# Patient Record
Sex: Male | Born: 1965 | Race: White | Hispanic: No | Smoking: Current every day smoker
Health system: Southern US, Community
[De-identification: ages and names within clinical notes are randomized; demographics above are authoritative.]

## PROBLEM LIST (undated history)

## (undated) DIAGNOSIS — E559 Vitamin D deficiency, unspecified: Secondary | ICD-10-CM

## (undated) DIAGNOSIS — F419 Anxiety disorder, unspecified: Secondary | ICD-10-CM

## (undated) DIAGNOSIS — R51 Headache: Secondary | ICD-10-CM

## (undated) DIAGNOSIS — M1990 Unspecified osteoarthritis, unspecified site: Secondary | ICD-10-CM

## (undated) DIAGNOSIS — K219 Gastro-esophageal reflux disease without esophagitis: Secondary | ICD-10-CM

## (undated) DIAGNOSIS — R519 Headache, unspecified: Secondary | ICD-10-CM

## (undated) DIAGNOSIS — F4310 Post-traumatic stress disorder, unspecified: Secondary | ICD-10-CM

## (undated) HISTORY — PX: FINGER SURGERY: SHX640

## (undated) HISTORY — PX: KNEE SURGERY: SHX244

## (undated) HISTORY — PX: TONSILLECTOMY: SUR1361

## (undated) HISTORY — PX: SHOULDER SURGERY: SHX246

---

## 2015-01-03 ENCOUNTER — Other Ambulatory Visit (HOSPITAL_BASED_OUTPATIENT_CLINIC_OR_DEPARTMENT_OTHER): Payer: Self-pay | Admitting: General Practice

## 2015-01-03 ENCOUNTER — Ambulatory Visit (HOSPITAL_BASED_OUTPATIENT_CLINIC_OR_DEPARTMENT_OTHER)
Admission: RE | Admit: 2015-01-03 | Discharge: 2015-01-03 | Disposition: A | Discharge status: Home / Self Care | Payer: Commercial Managed Care - PPO | Source: Ambulatory Visit | Attending: General Practice | Admitting: General Practice

## 2015-01-03 DIAGNOSIS — N50812 Left testicular pain: Secondary | ICD-10-CM

## 2015-01-03 DIAGNOSIS — N508 Other specified disorders of male genital organs: Secondary | ICD-10-CM | POA: Diagnosis present

## 2015-07-01 ENCOUNTER — Other Ambulatory Visit (HOSPITAL_COMMUNITY): Payer: Self-pay | Admitting: Neurological Surgery

## 2015-07-22 ENCOUNTER — Encounter (HOSPITAL_COMMUNITY): Payer: Self-pay

## 2015-07-22 ENCOUNTER — Encounter (HOSPITAL_COMMUNITY)
Admission: RE | Admit: 2015-07-22 | Discharge: 2015-07-22 | Disposition: A | Discharge status: Home / Self Care | Payer: Commercial Managed Care - PPO | Source: Ambulatory Visit | Attending: Neurological Surgery | Admitting: Neurological Surgery

## 2015-07-22 ENCOUNTER — Other Ambulatory Visit (HOSPITAL_COMMUNITY): Payer: Self-pay | Admitting: *Deleted

## 2015-07-22 ENCOUNTER — Ambulatory Visit (HOSPITAL_COMMUNITY)
Admission: RE | Admit: 2015-07-22 | Discharge: 2015-07-22 | Disposition: A | Discharge status: Home / Self Care | Payer: Commercial Managed Care - PPO | Source: Ambulatory Visit | Attending: Neurological Surgery | Admitting: Neurological Surgery

## 2015-07-22 DIAGNOSIS — Z01811 Encounter for preprocedural respiratory examination: Secondary | ICD-10-CM | POA: Insufficient documentation

## 2015-07-22 DIAGNOSIS — F17200 Nicotine dependence, unspecified, uncomplicated: Secondary | ICD-10-CM

## 2015-07-22 DIAGNOSIS — M4802 Spinal stenosis, cervical region: Secondary | ICD-10-CM

## 2015-07-22 HISTORY — DX: Gastro-esophageal reflux disease without esophagitis: K21.9

## 2015-07-22 HISTORY — DX: Anxiety disorder, unspecified: F41.9

## 2015-07-22 HISTORY — DX: Headache, unspecified: R51.9

## 2015-07-22 HISTORY — DX: Unspecified osteoarthritis, unspecified site: M19.90

## 2015-07-22 HISTORY — DX: Vitamin D deficiency, unspecified: E55.9

## 2015-07-22 HISTORY — DX: Headache: R51

## 2015-07-22 HISTORY — DX: Post-traumatic stress disorder, unspecified: F43.10

## 2015-07-22 LAB — BASIC METABOLIC PANEL
ANION GAP: 8 (ref 5–15)
BUN: 7 mg/dL (ref 6–20)
CALCIUM: 10 mg/dL (ref 8.9–10.3)
CO2: 28 mmol/L (ref 22–32)
Chloride: 102 mmol/L (ref 101–111)
Creatinine, Ser: 0.93 mg/dL (ref 0.61–1.24)
GFR calc Af Amer: >60 mL/min (ref >60)
GFR calc non Af Amer: >60 mL/min (ref >60)
GLUCOSE: 91 mg/dL (ref 65–99)
Potassium: 4.6 mmol/L (ref 3.5–5.1)
Sodium: 138 mmol/L (ref 135–145)

## 2015-07-22 LAB — NO BLOOD PRODUCTS

## 2015-07-22 LAB — CBC WITH DIFFERENTIAL/PLATELET
BASOS PCT: 1 %
Basophils Absolute: 0.1 10*3/uL (ref 0.0–0.1)
Eosinophils Absolute: 0.3 10*3/uL (ref 0.0–0.7)
Eosinophils Relative: 3 %
HEMATOCRIT: 43.5 % (ref 39.0–52.0)
Hemoglobin: 14.9 g/dL (ref 13.0–17.0)
LYMPHS ABS: 3.8 10*3/uL (ref 0.7–4.0)
LYMPHS PCT: 45 %
MCH: 32.7 pg (ref 26.0–34.0)
MCHC: 34.3 g/dL (ref 30.0–36.0)
MCV: 95.6 fL (ref 78.0–100.0)
MONO ABS: 0.6 10*3/uL (ref 0.1–1.0)
MONOS PCT: 8 %
NEUTROS ABS: 3.6 10*3/uL (ref 1.7–7.7)
Neutrophils Relative %: 43 %
Platelets: 320 10*3/uL (ref 150–400)
RBC: 4.55 MIL/uL (ref 4.22–5.81)
RDW: 12 % (ref 11.5–15.5)
WBC: 8.4 10*3/uL (ref 4.0–10.5)

## 2015-07-22 LAB — SURGICAL PCR SCREEN
MRSA, PCR: NEGATIVE
Staphylococcus aureus: POSITIVE — AB

## 2015-07-22 LAB — PROTIME-INR
INR: 0.94 (ref 0.00–1.49)
Prothrombin Time: 12.8 seconds (ref 11.6–15.2)

## 2015-07-22 NOTE — Pre-Procedure Instructions (Signed)
Anthony Russo  07/22/2015     Your procedure is scheduled on Wednesday, July 24, 2015 at 2:00 PM.   Report to [facility] Entrance "A" Admitting Office at 12:00 Noon.   Call this number if you have problems the morning of surgery: [phone]   Any questions prior to day of surgery, please call [phone] between 8 & 4 PM.    Remember:  Do not eat food or drink liquids after midnight Tuesday, 07/23/15.  Take these medicines the morning of surgery with A SIP OF WATER: Bupropion (Wellbutrin), Cetirizine (Zyrtec), Fluoxetine (Prozac), Gabapentin (Neurontin), Loratadine (Claritin), Omeprazole (Prilosec), Cyclobenzaprine (Flexeril), Hydrocodone - if needed  Stop Multivitamins as of today   Do not wear jewelry.  Do not wear lotions, powders, or cologne.  You may wear deodorant.  Men may shave face and neck.  Do not bring valuables to the hospital.  [facility] is not responsible for any belongings or valuables.  Contacts, dentures or bridgework may not be worn into surgery.  Leave your suitcase in the car.  After surgery it may be brought to your room.  For patients admitted to the hospital, discharge time will be determined by your treatment team.  Special instructions:  [place] - Preparing for Surgery  Before surgery, you can play an important role.  Because skin is not sterile, your skin needs to be as free of germs as possible.  You can reduce the number of germs on you skin by washing with CHG (chlorahexidine gluconate) soap before surgery.  CHG is an antiseptic cleaner which kills germs and bonds with the skin to continue killing germs even after washing.  Please DO NOT use if you have an allergy to CHG or antibacterial soaps.  If your skin becomes reddened/irritated stop using the CHG and inform your nurse when you arrive at Short Stay.  Do not shave (including legs and underarms) for at least 48 hours prior to the first CHG shower.  You may shave your  face.  Please follow these instructions carefully:   1.  Shower with CHG Soap the night before surgery and the                                morning of Surgery.  2.  If you choose to wash your hair, wash your hair first as usual with your       normal shampoo.  3.  After you shampoo, rinse your hair and body thoroughly to remove the                      Shampoo.  4.  Use CHG as you would any other liquid soap.  You can apply chg directly       to the skin and wash gently with scrungie or a clean washcloth.  5.  Apply the CHG Soap to your body ONLY FROM THE NECK DOWN.        Do not use on open wounds or open sores.  Avoid contact with your eyes, ears, mouth and genitals (private parts).  Wash genitals (private parts) with your normal soap.  6.  Wash thoroughly, paying special attention to the area where your surgery        will be performed.  7.  Thoroughly rinse your body with warm water from the neck down.  8.  DO NOT shower/wash with your normal  soap after using and rinsing off       the CHG Soap.  9.  Pat yourself dry with a clean towel.            10.  Wear clean pajamas.            11.  Place clean sheets on your bed the night of your first shower and do not        sleep with pets.  Day of Surgery  Do not apply any lotions the morning of surgery.  Please wear clean clothes to the hospital.   Please read over the following fact sheets that you were given. Pain Booklet, Coughing and Deep Breathing, MRSA Information and Surgical Site Infection Prevention

## 2015-07-22 NOTE — Progress Notes (Signed)
Mupirocin Ointment Rx called into [facility] in [place] for positive PCR of Staph. Pt notified and voiced understanding.

## 2015-07-22 NOTE — Progress Notes (Signed)
Pt denies cardiac history, chest pain or sob. He states he did have an EKG done in October at [facility]. 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 Written report in EPIC, have requested EKG tracing from [facility] and also the ED notes.  Pt is a Jehovah's Witness, Blood refusal consent form signed and faxed to the Blood Bank. Pt did not have his Blood/Healthcare Power of [place] with him today, requested that pt bring that with him DOS.

## 2015-07-23 MED ORDER — DEXTROSE 5 % IV SOLN
3.0000 g | INTRAVENOUS | Status: AC
  Administered 2015-07-24: 3 g via INTRAVENOUS
  Filled 2015-07-23: qty 3000

## 2015-07-23 MED ORDER — DEXAMETHASONE SODIUM PHOSPHATE 10 MG/ML IJ SOLN
10.0000 mg | INTRAMUSCULAR | Status: AC
  Administered 2015-07-24: 10 mg via INTRAVENOUS

## 2015-07-24 ENCOUNTER — Encounter (HOSPITAL_COMMUNITY)
Admission: AD | Disposition: A | Discharge status: Home / Self Care | Payer: Self-pay | Source: Ambulatory Visit | Attending: Neurological Surgery

## 2015-07-24 ENCOUNTER — Encounter (HOSPITAL_COMMUNITY): Payer: Self-pay | Admitting: Certified Registered"

## 2015-07-24 ENCOUNTER — Inpatient Hospital Stay (HOSPITAL_COMMUNITY): Payer: Commercial Managed Care - PPO | Admitting: Certified Registered"

## 2015-07-24 ENCOUNTER — Inpatient Hospital Stay (HOSPITAL_COMMUNITY)
Admission: AD | Admit: 2015-07-24 | Discharge: 2015-07-25 | DRG: 473 | Disposition: A | Discharge status: Home / Self Care | Payer: Commercial Managed Care - PPO | Source: Ambulatory Visit | Attending: Neurological Surgery | Admitting: Neurological Surgery

## 2015-07-24 ENCOUNTER — Inpatient Hospital Stay (HOSPITAL_COMMUNITY): Payer: Commercial Managed Care - PPO

## 2015-07-24 DIAGNOSIS — M4802 Spinal stenosis, cervical region: Principal | ICD-10-CM | POA: Diagnosis present

## 2015-07-24 DIAGNOSIS — M50221 Other cervical disc displacement at C4-C5 level: Secondary | ICD-10-CM | POA: Diagnosis present

## 2015-07-24 DIAGNOSIS — Z419 Encounter for procedure for purposes other than remedying health state, unspecified: Secondary | ICD-10-CM

## 2015-07-24 DIAGNOSIS — F419 Anxiety disorder, unspecified: Secondary | ICD-10-CM | POA: Diagnosis present

## 2015-07-24 DIAGNOSIS — Z981 Arthrodesis status: Secondary | ICD-10-CM

## 2015-07-24 DIAGNOSIS — Z791 Long term (current) use of non-steroidal anti-inflammatories (NSAID): Secondary | ICD-10-CM

## 2015-07-24 DIAGNOSIS — K219 Gastro-esophageal reflux disease without esophagitis: Secondary | ICD-10-CM | POA: Diagnosis present

## 2015-07-24 DIAGNOSIS — M479 Spondylosis, unspecified: Secondary | ICD-10-CM | POA: Diagnosis present

## 2015-07-24 DIAGNOSIS — Z01812 Encounter for preprocedural laboratory examination: Secondary | ICD-10-CM

## 2015-07-24 DIAGNOSIS — Z7952 Long term (current) use of systemic steroids: Secondary | ICD-10-CM

## 2015-07-24 DIAGNOSIS — E559 Vitamin D deficiency, unspecified: Secondary | ICD-10-CM | POA: Diagnosis present

## 2015-07-24 DIAGNOSIS — F17210 Nicotine dependence, cigarettes, uncomplicated: Secondary | ICD-10-CM | POA: Diagnosis present

## 2015-07-24 DIAGNOSIS — F4310 Post-traumatic stress disorder, unspecified: Secondary | ICD-10-CM | POA: Diagnosis present

## 2015-07-24 HISTORY — PX: ANTERIOR CERVICAL DECOMP/DISCECTOMY FUSION: SHX1161

## 2015-07-24 SURGERY — ANTERIOR CERVICAL DECOMPRESSION/DISCECTOMY FUSION 2 LEVELS
Anesthesia: General | Site: Neck

## 2015-07-24 MED ORDER — MIDAZOLAM HCL 2 MG/2ML IJ SOLN
INTRAMUSCULAR | Status: AC
  Filled 2015-07-24: qty 2

## 2015-07-24 MED ORDER — SUGAMMADEX SODIUM 500 MG/5ML IV SOLN
INTRAVENOUS | Status: AC
  Filled 2015-07-24: qty 5

## 2015-07-24 MED ORDER — CEFAZOLIN SODIUM 1-5 GM-% IV SOLN
1.0000 g | Freq: Three times a day (TID) | INTRAVENOUS | Status: AC
  Administered 2015-07-24 – 2015-07-25 (×2): 1 g via INTRAVENOUS
  Filled 2015-07-24 (×2): qty 50

## 2015-07-24 MED ORDER — ROCURONIUM BROMIDE 50 MG/5ML IV SOLN
INTRAVENOUS | Status: AC
  Filled 2015-07-24: qty 1

## 2015-07-24 MED ORDER — ROCURONIUM BROMIDE 100 MG/10ML IV SOLN
INTRAVENOUS | Status: DC | PRN
  Administered 2015-07-24: 50 mg via INTRAVENOUS
  Administered 2015-07-24 (×3): 10 mg via INTRAVENOUS

## 2015-07-24 MED ORDER — LIDOCAINE HCL (CARDIAC) 20 MG/ML IV SOLN
INTRAVENOUS | Status: AC
  Filled 2015-07-24: qty 5

## 2015-07-24 MED ORDER — PREDNISONE 50 MG PO TABS: 50.0000 mg | ORAL_TABLET | Freq: Every day | ORAL | Status: DC

## 2015-07-24 MED ORDER — MEPERIDINE HCL 25 MG/ML IJ SOLN: 6.2500 mg | INTRAMUSCULAR | Status: DC | PRN

## 2015-07-24 MED ORDER — FENTANYL CITRATE (PF) 250 MCG/5ML IJ SOLN
INTRAMUSCULAR | Status: DC | PRN
  Administered 2015-07-24: 150 ug via INTRAVENOUS
  Administered 2015-07-24 (×7): 50 ug via INTRAVENOUS
  Administered 2015-07-24: 100 ug via INTRAVENOUS

## 2015-07-24 MED ORDER — PROPOFOL 10 MG/ML IV BOLUS
INTRAVENOUS | Status: AC
  Filled 2015-07-24: qty 20

## 2015-07-24 MED ORDER — SODIUM CHLORIDE 0.9 % IR SOLN
Status: DC | PRN
  Administered 2015-07-24: 14:00:00

## 2015-07-24 MED ORDER — FENTANYL CITRATE (PF) 250 MCG/5ML IJ SOLN
INTRAMUSCULAR | Status: AC
  Filled 2015-07-24: qty 5

## 2015-07-24 MED ORDER — GABAPENTIN 100 MG PO CAPS
100.0000 mg | ORAL_CAPSULE | Freq: Three times a day (TID) | ORAL | Status: DC
  Administered 2015-07-24: 100 mg via ORAL
  Filled 2015-07-24: qty 1

## 2015-07-24 MED ORDER — MORPHINE SULFATE (PF) 2 MG/ML IV SOLN: 1.0000 mg | INTRAVENOUS | Status: DC | PRN

## 2015-07-24 MED ORDER — FLUOXETINE HCL 20 MG PO TABS
40.0000 mg | ORAL_TABLET | Freq: Every day | ORAL | Status: DC
  Filled 2015-07-24: qty 2

## 2015-07-24 MED ORDER — THROMBIN 20000 UNITS EX SOLR
CUTANEOUS | Status: DC | PRN
  Administered 2015-07-24: 14:00:00 via TOPICAL

## 2015-07-24 MED ORDER — OXYCODONE-ACETAMINOPHEN 5-325 MG PO TABS
1.0000 | ORAL_TABLET | ORAL | Status: DC | PRN
  Administered 2015-07-24 – 2015-07-25 (×2): 2 via ORAL
  Filled 2015-07-24: qty 2

## 2015-07-24 MED ORDER — ONDANSETRON HCL 4 MG/2ML IJ SOLN: 4.0000 mg | INTRAMUSCULAR | Status: DC | PRN

## 2015-07-24 MED ORDER — 0.9 % SODIUM CHLORIDE (POUR BTL) OPTIME
TOPICAL | Status: DC | PRN
  Administered 2015-07-24: 1000 mL

## 2015-07-24 MED ORDER — LIDOCAINE HCL (CARDIAC) 20 MG/ML IV SOLN
INTRAVENOUS | Status: DC | PRN
  Administered 2015-07-24: 100 mg via INTRAVENOUS

## 2015-07-24 MED ORDER — HYDROMORPHONE HCL 1 MG/ML IJ SOLN
INTRAMUSCULAR | Status: AC
  Administered 2015-07-24: 0.5 mg via INTRAVENOUS
  Filled 2015-07-24: qty 1

## 2015-07-24 MED ORDER — ONDANSETRON HCL 4 MG/2ML IJ SOLN
INTRAMUSCULAR | Status: DC | PRN
  Administered 2015-07-24: 4 mg via INTRAVENOUS

## 2015-07-24 MED ORDER — ONDANSETRON HCL 4 MG/2ML IJ SOLN
INTRAMUSCULAR | Status: AC
  Filled 2015-07-24: qty 2

## 2015-07-24 MED ORDER — MENTHOL 3 MG MT LOZG: 1.0000 | LOZENGE | OROMUCOSAL | Status: DC | PRN

## 2015-07-24 MED ORDER — PROPOFOL 10 MG/ML IV BOLUS
INTRAVENOUS | Status: DC | PRN
  Administered 2015-07-24: 170 mg via INTRAVENOUS

## 2015-07-24 MED ORDER — POTASSIUM CHLORIDE IN NACL 20-0.9 MEQ/L-% IV SOLN
INTRAVENOUS | Status: DC
  Filled 2015-07-24 (×2): qty 1000

## 2015-07-24 MED ORDER — BUPROPION HCL ER (SR) 150 MG PO TB12
150.0000 mg | ORAL_TABLET | Freq: Two times a day (BID) | ORAL | Status: DC
  Administered 2015-07-24: 150 mg via ORAL
  Filled 2015-07-24 (×2): qty 1

## 2015-07-24 MED ORDER — HYDROMORPHONE HCL 1 MG/ML IJ SOLN
0.2500 mg | INTRAMUSCULAR | Status: DC | PRN
  Administered 2015-07-24 (×2): 0.5 mg via INTRAVENOUS

## 2015-07-24 MED ORDER — THROMBIN 5000 UNITS EX SOLR
OROMUCOSAL | Status: DC | PRN
  Administered 2015-07-24: 14:00:00 via TOPICAL

## 2015-07-24 MED ORDER — SODIUM CHLORIDE 0.9 % IJ SOLN: 3.0000 mL | INTRAMUSCULAR | Status: DC | PRN

## 2015-07-24 MED ORDER — MIDAZOLAM HCL 5 MG/5ML IJ SOLN
INTRAMUSCULAR | Status: DC | PRN
  Administered 2015-07-24: 2 mg via INTRAVENOUS

## 2015-07-24 MED ORDER — ACETAMINOPHEN 650 MG RE SUPP
650.0000 mg | RECTAL | Status: DC | PRN
Start: 2015-07-24 — End: 2015-07-25

## 2015-07-24 MED ORDER — SODIUM CHLORIDE 0.9 % IV SOLN: 250.0000 mL | INTRAVENOUS | Status: DC

## 2015-07-24 MED ORDER — FLUOXETINE HCL 20 MG PO CAPS
40.0000 mg | ORAL_CAPSULE | Freq: Every day | ORAL | Status: DC
  Administered 2015-07-24: 40 mg via ORAL
  Filled 2015-07-24: qty 2

## 2015-07-24 MED ORDER — LACTATED RINGERS IV SOLN
INTRAVENOUS | Status: DC
  Administered 2015-07-24 (×2): via INTRAVENOUS

## 2015-07-24 MED ORDER — SENNA 8.6 MG PO TABS
1.0000 | ORAL_TABLET | Freq: Two times a day (BID) | ORAL | Status: DC
  Administered 2015-07-24: 8.6 mg via ORAL
  Filled 2015-07-24: qty 1

## 2015-07-24 MED ORDER — CYCLOBENZAPRINE HCL 10 MG PO TABS
10.0000 mg | ORAL_TABLET | Freq: Three times a day (TID) | ORAL | Status: DC | PRN
  Administered 2015-07-24: 10 mg via ORAL
  Filled 2015-07-24: qty 1

## 2015-07-24 MED ORDER — SUGAMMADEX SODIUM 500 MG/5ML IV SOLN
INTRAVENOUS | Status: DC | PRN
  Administered 2015-07-24: 30 mg via INTRAVENOUS

## 2015-07-24 MED ORDER — PHENOL 1.4 % MT LIQD: 1.0000 | OROMUCOSAL | Status: DC | PRN

## 2015-07-24 MED ORDER — MUPIROCIN 2 % EX OINT
1.0000 "application " | TOPICAL_OINTMENT | Freq: Two times a day (BID) | CUTANEOUS | Status: DC
  Administered 2015-07-24: 1 via NASAL
  Filled 2015-07-24: qty 22

## 2015-07-24 MED ORDER — SODIUM CHLORIDE 0.9 % IJ SOLN
3.0000 mL | Freq: Two times a day (BID) | INTRAMUSCULAR | Status: DC
  Administered 2015-07-24: 3 mL via INTRAVENOUS

## 2015-07-24 MED ORDER — OXYCODONE-ACETAMINOPHEN 5-325 MG PO TABS
ORAL_TABLET | ORAL | Status: AC
  Administered 2015-07-24: 2 via ORAL
  Filled 2015-07-24: qty 2

## 2015-07-24 MED ORDER — ONDANSETRON HCL 4 MG/2ML IJ SOLN: 4.0000 mg | Freq: Once | INTRAMUSCULAR | Status: DC | PRN

## 2015-07-24 MED ORDER — ACETAMINOPHEN 325 MG PO TABS: 650.0000 mg | ORAL_TABLET | ORAL | Status: DC | PRN

## 2015-07-24 SURGICAL SUPPLY — 60 items
APL SKNCLS STERI-STRIP NONHPOA (GAUZE/BANDAGES/DRESSINGS) ×2
BAG DECANTER FOR FLEXI CONT (MISCELLANEOUS) ×4 IMPLANT
BENZOIN TINCTURE PRP APPL 2/3 (GAUZE/BANDAGES/DRESSINGS) ×4 IMPLANT
BIT DRILL 13 (BIT) ×2 IMPLANT
BIT DRILL 13MM (BIT) ×1
BUR MATCHSTICK NEURO 3.0 LAGG (BURR) ×4 IMPLANT
CAGE LORDOTIC 8 SM PLUS (Cage) ×4 IMPLANT
CAGE SMALL 7X13X15 (Cage) ×4 IMPLANT
CANISTER SUCT 3000ML PPV (MISCELLANEOUS) ×4 IMPLANT
CLOSURE WOUND 1/2 X4 (GAUZE/BANDAGES/DRESSINGS) ×1
DRAPE C-ARM 42X72 X-RAY (DRAPES) ×8 IMPLANT
DRAPE LAPAROTOMY 100X72 PEDS (DRAPES) ×4 IMPLANT
DRAPE MICROSCOPE LEICA (MISCELLANEOUS) ×4 IMPLANT
DRAPE POUCH INSTRU U-SHP 10X18 (DRAPES) ×4 IMPLANT
DRSG OPSITE POSTOP 4X6 (GAUZE/BANDAGES/DRESSINGS) ×4 IMPLANT
DURAPREP 6ML APPLICATOR 50/CS (WOUND CARE) ×4 IMPLANT
ELECT COATED BLADE 2.86 ST (ELECTRODE) ×4 IMPLANT
ELECT REM PT RETURN 9FT ADLT (ELECTROSURGICAL) ×4
ELECTRODE REM PT RTRN 9FT ADLT (ELECTROSURGICAL) ×2 IMPLANT
GAUZE SPONGE 4X4 16PLY XRAY LF (GAUZE/BANDAGES/DRESSINGS) IMPLANT
GLOVE BIO SURGEON STRL SZ8 (GLOVE) ×4 IMPLANT
GLOVE BIOGEL PI IND STRL 7.0 (GLOVE) ×1 IMPLANT
GLOVE BIOGEL PI IND STRL 7.5 (GLOVE) ×2 IMPLANT
GLOVE BIOGEL PI INDICATOR 7.0 (GLOVE) ×2
GLOVE BIOGEL PI INDICATOR 7.5 (GLOVE) ×2
GLOVE ECLIPSE 6.5 STRL STRAW (GLOVE) ×4 IMPLANT
GLOVE SS N UNI LF 6.5 STRL (GLOVE) ×9 IMPLANT
GLOVE SS N UNI LF 7.0 STRL (GLOVE) ×12 IMPLANT
GOWN STRL REUS W/ TWL LRG LVL3 (GOWN DISPOSABLE) ×4 IMPLANT
GOWN STRL REUS W/ TWL XL LVL3 (GOWN DISPOSABLE) ×3 IMPLANT
GOWN STRL REUS W/TWL 2XL LVL3 (GOWN DISPOSABLE) IMPLANT
GOWN STRL REUS W/TWL LRG LVL3 (GOWN DISPOSABLE) ×8
GOWN STRL REUS W/TWL XL LVL3 (GOWN DISPOSABLE) ×8
HALTER HD/CHIN CERV TRACTION D (MISCELLANEOUS) IMPLANT
HEMOSTAT POWDER KIT SURGIFOAM (HEMOSTASIS) ×4 IMPLANT
KIT BASIN OR (CUSTOM PROCEDURE TRAY) ×4 IMPLANT
KIT ROOM TURNOVER OR (KITS) ×4 IMPLANT
NDL HYPO 25X1 1.5 SAFETY (NEEDLE) ×1 IMPLANT
NDL SPNL 20GX3.5 QUINCKE YW (NEEDLE) ×1 IMPLANT
NEEDLE HYPO 25X1 1.5 SAFETY (NEEDLE) ×4 IMPLANT
NEEDLE SPNL 20GX3.5 QUINCKE YW (NEEDLE) ×4 IMPLANT
NS IRRIG 1000ML POUR BTL (IV SOLUTION) ×4 IMPLANT
PACK LAMINECTOMY NEURO (CUSTOM PROCEDURE TRAY) ×4 IMPLANT
PAD ARMBOARD 7.5X6 YLW CONV (MISCELLANEOUS) ×7 IMPLANT
PIN DISTRACTION 14MM (PIN) ×8 IMPLANT
PLATE 2 42.5XLCK NS SPNE CVD (Plate) ×1 IMPLANT
PLATE 2 ATLANTIS TRANS (Plate) ×4 IMPLANT
RUBBERBAND STERILE (MISCELLANEOUS) ×8 IMPLANT
SCREW SD 15MM FA (Screw) ×9 IMPLANT
SCREW ST 15X4XST FXANG NS (Screw) ×8 IMPLANT
SCREW ST FIX 4 ATL (Screw) ×16 IMPLANT
SPONGE INTESTINAL PEANUT (DISPOSABLE) ×4 IMPLANT
SPONGE SURGIFOAM ABS GEL 100 (HEMOSTASIS) ×4 IMPLANT
STRIP CLOSURE SKIN 1/2X4 (GAUZE/BANDAGES/DRESSINGS) ×3 IMPLANT
SUT VIC AB 3-0 SH 8-18 (SUTURE) ×7 IMPLANT
TOWEL OR 17X24 6PK STRL BLUE (TOWEL DISPOSABLE) ×4 IMPLANT
TOWEL OR 17X26 10 PK STRL BLUE (TOWEL DISPOSABLE) ×4 IMPLANT
TRAP SPECIMEN MUCOUS 40CC (MISCELLANEOUS) ×4 IMPLANT
TRAY FOLEY CATH 16FRSI W/METER (SET/KITS/TRAYS/PACK) ×4 IMPLANT
WATER STERILE IRR 1000ML POUR (IV SOLUTION) ×4 IMPLANT

## 2015-07-24 NOTE — Anesthesia Procedure Notes (Signed)
Procedure Name: Intubation Date/Time: 07/24/2015 2:25 PM Performed by: Jerilee HohMUMM, Lynne Righi N Pre-anesthesia Checklist: Patient identified, Emergency Drugs available, Suction available and Patient being monitored Patient Re-evaluated:Patient Re-evaluated prior to inductionOxygen Delivery Method: Circle system utilized Preoxygenation: Pre-oxygenation with 100% oxygen Intubation Type: IV induction Ventilation: Mask ventilation without difficulty and Oral airway inserted - appropriate to patient size Laryngoscope Size: Glidescope and 3 Grade View: Grade III Tube type: Oral Tube size: 7.5 mm Number of attempts: 2 Airway Equipment and Method: Stylet and Video-laryngoscopy Placement Confirmation: ETT inserted through vocal cords under direct vision,  positive ETCO2 and breath sounds checked- equal and bilateral Secured at: 22 cm Tube secured with: Tape Dental Injury: Teeth and Oropharynx as per pre-operative assessment  Comments: Easy mask with oral airway in place. DL with MAC 4, view of epiglottis only. DL with Glidescope 3, partial view of cords with Glidescope, atraumatic oral intubation.

## 2015-07-24 NOTE — Transfer of Care (Signed)
Immediate Anesthesia Transfer of Care Note  Patient: Anthony Russo  Procedure(s) Performed: Procedure(s): Anterior Cervical Discectomy Fusion Cervical four-five, Cervical five-six  Patient Location: PACU  Anesthesia Type:General  Level of Consciousness: awake, alert , oriented and patient cooperative  Airway & Oxygen Therapy: Patient Spontanous Breathing and Patient connected to nasal cannula oxygen  Post-op Assessment: Report given to RN, Post -op Vital signs reviewed and stable and Patient moving all extremities  Post vital signs: Reviewed and stable  Last Vitals:  Filed Vitals:   07/24/15 1154 07/24/15 1706  BP: 141/84   Pulse: 102   Temp: 37.1 C 36.6 C  Resp: 18     Complications: No apparent anesthesia complications

## 2015-07-24 NOTE — Anesthesia Preprocedure Evaluation (Signed)
Anesthesia Evaluation  Patient identified by MRN, date of birth, ID band Patient awake    Reviewed: Allergy & Precautions, NPO status , Patient's Chart, lab work & pertinent test results  Airway Mallampati: I  TM Distance: >3 FB Neck ROM: Full    Dental   Pulmonary Current Smoker,    Pulmonary exam normal        Cardiovascular Normal cardiovascular exam     Neuro/Psych    GI/Hepatic GERD  Medicated and Controlled,  Endo/Other    Renal/GU      Musculoskeletal   Abdominal   Peds  Hematology   Anesthesia Other Findings   Reproductive/Obstetrics                             Anesthesia Physical Anesthesia Plan  ASA: II  Anesthesia Plan: General   Post-op Pain Management:    Induction: Intravenous  Airway Management Planned: Oral ETT  Additional Equipment:   Intra-op Plan:   Post-operative Plan: Extubation in OR  Informed Consent:   Plan Discussed with: CRNA and Surgeon  Anesthesia Plan Comments:         Anesthesia Quick Evaluation

## 2015-07-24 NOTE — H&P (Signed)
Subjective:   Patient is a 49 y.o. male admitted for left arm pain. The patient first presented to me with complaints of neck pain, shooting pains in the arm(s) and numbness of the arm(s). Onset of symptoms was several months ago. The pain is described as aching and stabbing and occurs all day. The pain is rated severe, and is located  at the base of the neck and radiates to the left arm. The symptoms have been progressive. Symptoms are exacerbated by extending head backwards, and are relieved by none.  Previous work up includes MRI of cervical spine, results: spinal stenosis.  Past Medical History  Diagnosis Date  . Anxiety   . PTSD (post-traumatic stress disorder)   . GERD (gastroesophageal reflux disease)   . Headache     migraines  . Arthritis     neck and right wrist  . Vitamin D deficiency     Past Surgical History  Procedure Laterality Date  . Shoulder surgery Right   . Knee surgery Right     removed schrapnel from knee  . Tonsillectomy    . Finger surgery Right     ring finger reattached    No Known Allergies  Social History  Substance Use Topics  . Smoking status: Current Every Day Smoker -- 0.50 packs/day    Types: Cigarettes  . Smokeless tobacco: Former NeurosurgeonUser  . Alcohol Use: Yes     Comment: occasional    Family History  Problem Relation Age of Onset  . Hypertension Mother   . COPD Mother   . Lupus Mother   . Lung cancer Father    Prior to Admission medications   Medication Sig Start Date End Date Taking? Authorizing Provider  buPROPion (WELLBUTRIN SR) 150 MG 12 hr tablet Take 150 mg by mouth 2 (two) times daily.   Yes Historical Provider, MD  cetirizine (ZYRTEC) 10 MG tablet Take 10 mg by mouth daily.   Yes Historical Provider, MD  cholecalciferol (VITAMIN D) 1000 UNITS tablet Take 1,000 Units by mouth daily.   Yes Historical Provider, MD  cyclobenzaprine (FLEXERIL) 10 MG tablet Take 10 mg by mouth daily.   Yes Historical Provider, MD  FLUoxetine (PROZAC) 40  MG capsule Take 40 mg by mouth daily.   Yes Historical Provider, MD  gabapentin (NEURONTIN) 100 MG capsule Take 100 mg by mouth 3 (three) times daily.   Yes Historical Provider, MD  HYDROcodone-acetaminophen (NORCO/VICODIN) 5-325 MG tablet Take 1 tablet by mouth every 6 (six) hours as needed for moderate pain.   Yes Historical Provider, MD  ibuprofen (ADVIL,MOTRIN) 800 MG tablet Take 800 mg by mouth every 8 (eight) hours as needed for mild pain.   Yes Historical Provider, MD  loratadine (CLARITIN) 10 MG tablet Take 10 mg by mouth daily.   Yes Historical Provider, MD  meloxicam (MOBIC) 15 MG tablet Take 15 mg by mouth daily.   Yes Historical Provider, MD  Multiple Vitamin (MULTIVITAMIN WITH MINERALS) TABS tablet Take 1 tablet by mouth daily.   Yes Historical Provider, MD  omeprazole (PRILOSEC) 40 MG capsule Take 40 mg by mouth daily.   Yes Historical Provider, MD  predniSONE (DELTASONE) 50 MG tablet Take 50 mg by mouth daily with breakfast.   Yes Historical Provider, MD     Review of Systems  Positive ROS: Negative  All other systems have been reviewed and were otherwise negative with the exception of those mentioned in the HPI and as above.  Objective: Vital signs in last  24 hours: Temp:  [98.7 F (37.1 C)] 98.7 F (37.1 C) (11/30 1154) Pulse Rate:  [102] 102 (11/30 1154) Resp:  [18] 18 (11/30 1154) BP: (141)/(84) 141/84 mmHg (11/30 1154) SpO2:  [97 %] 97 % (11/30 1154) Weight:  [131.77 kg (290 lb 8 oz)] 131.77 kg (290 lb 8 oz) (11/30 1154)  General Appearance: Alert, cooperative, no distress, appears stated age Head: Normocephalic, without obvious abnormality, atraumatic Eyes: PERRL, conjunctiva/corneas clear, EOM's intact      Neck: Supple, symmetrical, trachea midline, Back: Symmetric, no curvature, ROM normal, no CVA tenderness Lungs:  respirations unlabored Heart: Regular rate and rhythm Abdomen: Soft, non-tender Extremities: Extremities normal, atraumatic, no cyanosis or  edema Pulses: 2+ and symmetric all extremities Skin: Skin color, texture, turgor normal, no rashes or lesions  NEUROLOGIC:  Mental status: Alert and oriented x4, no aphasia, good attention span, fund of knowledge and memory  Motor Exam - grossly normal Sensory Exam - grossly normal Reflexes: 1+ Coordination - grossly normal Gait - grossly normal Balance - grossly normal Cranial Nerves: I: smell Not tested  II: visual acuity  OS: nl    OD: nl  II: visual fields Full to confrontation  II: pupils Equal, round, reactive to light  III,VII: ptosis None  III,IV,VI: extraocular muscles  Full ROM  V: mastication Normal  V: facial light touch sensation  Normal  V,VII: corneal reflex  Present  VII: facial muscle function - upper  Normal  VII: facial muscle function - lower Normal  VIII: hearing Not tested  IX: soft palate elevation  Normal  IX,X: gag reflex Present  XI: trapezius strength  5/5  XI: sternocleidomastoid strength 5/5  XI: neck flexion strength  5/5  XII: tongue strength  Normal    Data Review Lab Results  Component Value Date   WBC 8.4 07/22/2015   HGB 14.9 07/22/2015   HCT 43.5 07/22/2015   MCV 95.6 07/22/2015   PLT 320 07/22/2015   Lab Results  Component Value Date   NA 138 07/22/2015   K 4.6 07/22/2015   CL 102 07/22/2015   CO2 28 07/22/2015   BUN 7 07/22/2015   CREATININE 0.93 07/22/2015   GLUCOSE 91 07/22/2015   Lab Results  Component Value Date   INR 0.94 07/22/2015    Assessment:   Cervical neck pain with herniated nucleus pulposus/ spondylosis/ stenosis at C4-5 C5-6 C6-7. Patient has failed conservative therapy. Planned surgery : ACDF with plating C4-5 C5-C6 C6-7  Plan:   I explained the condition and procedure to the patient and answered any questions.  Patient wishes to proceed with procedure as planned. Understands risks/ benefits/ and expected or typical outcomes.  Remi Rester S 07/24/2015 2:06 PM

## 2015-07-24 NOTE — Op Note (Signed)
07/24/2015  5:01 PM  PATIENT:  Anthony Russo  49 y.o. male  PRE-OPERATIVE DIAGNOSIS:  Cervical spinal stenosis with neck and left arm pain  POST-OPERATIVE DIAGNOSIS:  Same   PROCEDURE:  1. Decompressive anterior cervical discectomy C4-5 C5-6, 2. Anterior cervical arthrodesis C4-5 C5-6 utilizing peek interbody cages packed with morselized autograft, 3. Anterior cervical plating C4-C6 utilizing a Atlantis translational plate  SURGEON:  Marikay Alaravid Sanuel Ladnier, MD  ASSISTANTS: Dr. Alease FrameNund Kumar  ANESTHESIA:   General  EBL: 100 ml  Total I/O In: 1600 [I.V.:1600] Out: 430 [Urine:330; Blood:100]  BLOOD ADMINISTERED:none  DRAINS: None   SPECIMEN:  No Specimen  INDICATION FOR PROCEDURE: This patient presented with a long history of neck and left arm pain. MRI showed cervical spondylosis at C4-5 C5-6 and C6-7 with moderate canal stenosis and bilateral foraminal stenosis C4-5, severe canal stenosis at C5-6, and a left-sided osteophyte at C6-7. My original plan was to do an ACDF with plating at C4-5 C5-6 and C6-7. Upon exposure of the anterior cervical spine and there was a large flowing osteophyte from the top of C5 to the bottom of C7. His C6-7 interspace had a significant lordotic angle pointing down into the chest and I did not feel that I could move the osteophyte and then perform an adequate discectomy without doing a C6 corpectomy. I did not believe this was necessary and therefore simply chose to complete my surgery at C4-5 and C5-6. I explained to him prior to the surgery that I may not be able to complete the C6-7 ACDF. Patient understood the risks, benefits, and alternatives and potential outcomes and wished to proceed.  PROCEDURE DETAILS: Patient was brought to the operating room placed under general endotracheal anesthesia. Patient was placed in the supine position on the operating room table. The neck was prepped with Duraprep and draped in a sterile fashion.   Three cc of local anesthesia was  injected and a transverse incision was made on the right side of the neck.  Dissection was carried down thru the subcutaneous tissue and the platysma was  elevated, opened, and undermined with Metzenbaum scissors.  Dissection was then carried out thru an avascular plane leaving the sternocleidomastoid carotid artery and jugular vein laterally and the trachea and esophagus medially. The ventral aspect of the vertebral column was identified and a localizing x-ray was taken. The C4-5 level was identified. It was a large flowing osteophyte from the top of C5 to the bottom of C7. There is a large osteophyte over the C4-5 disc space also. This was removed with a Leksell rongeur. The longus colli muscles were then elevated and the retractor was placed. The annulus was incised at C4-5 and the disc space entered. I then had to drill the anterior cervical spine at what I thought was the C5-6 level and to identify the C5-6 disc. This was because of the large anterior flowing osteophyte. Because of the angle of the spine and the large osteophyte over the C6-7 disc space and the lack of cord compression at C6-7 I decided to abort the C6-7 level. I did not feel I'll be able to adequately get the angle to perform a discectomy at that level without doing a corpectomy. Discectomy at C4-5 and C5-6 was performed with micro-curettes and pituitary rongeurs. I then used the high-speed drill to drill the endplates down to the level of the posterior longitudinal ligament. The drill shavings were saved in a mucous trap for later arthrodesis. The operating microscope was draped  and brought into the field provided additional magnification, illumination and visualization. Discectomy was continued posteriorly thru the disc space. Posterior longitudinal ligament was opened with a nerve hook, and then removed along with disc herniation and osteophytes, decompressing the spinal canal and thecal sac. We then continued to remove osteophytic  overgrowth and disc material decompressing the neural foramina and exiting nerve roots bilaterally. There were large osteophytes at both levels, on the left at C4-5 and in the midline and paracentral to the left at C5-6. The scope was angled up and down to help decompress and undercut the vertebral bodies. Once the decompression was completed we could pass a nerve hook circumferentially to assure adequate decompression in the midline and in the neural foramina. So by both visualization and palpation we felt we had an adequate decompression of the neural elements. We then measured the height of the intravertebral disc space and selected a 7 mm at C4-5 and 8 millimeter Peek interbody cage at C5-6 , packed with autograft.  It was then gently positioned in the intravertebral disc space and countersunk. I then used a Atlantis translational plate and placed fixed angle screws into the vertebral bodies and locked them into position. The wound was irrigated with bacitracin solution, checked for hemostasis which was established and confirmed. Once meticulous hemostasis was achieved, we then proceeded with closure. The platysma was closed with interrupted 3-0 undyed Vicryl suture, the subcuticular layer was closed with interrupted 3-0 undyed Vicryl suture. The skin edges were approximated with steristrips. The drapes were removed. A sterile dressing was applied. The patient was then awakened from general anesthesia and transferred to the recovery room in stable condition. At the end of the procedure all sponge, needle and instrument counts were correct.   PLAN OF CARE: Admit for overnight observation  PATIENT DISPOSITION:  PACU - hemodynamically stable.   Delay start of Pharmacological VTE agent (>24hrs) due to surgical blood loss or risk of bleeding:  yes

## 2015-07-25 ENCOUNTER — Encounter (HOSPITAL_COMMUNITY): Payer: Self-pay | Admitting: Neurological Surgery

## 2015-07-25 DIAGNOSIS — F17210 Nicotine dependence, cigarettes, uncomplicated: Secondary | ICD-10-CM | POA: Diagnosis present

## 2015-07-25 DIAGNOSIS — K219 Gastro-esophageal reflux disease without esophagitis: Secondary | ICD-10-CM | POA: Diagnosis present

## 2015-07-25 DIAGNOSIS — M79602 Pain in left arm: Secondary | ICD-10-CM | POA: Diagnosis present

## 2015-07-25 DIAGNOSIS — Z791 Long term (current) use of non-steroidal anti-inflammatories (NSAID): Secondary | ICD-10-CM | POA: Diagnosis not present

## 2015-07-25 DIAGNOSIS — E559 Vitamin D deficiency, unspecified: Secondary | ICD-10-CM | POA: Diagnosis present

## 2015-07-25 DIAGNOSIS — Z01812 Encounter for preprocedural laboratory examination: Secondary | ICD-10-CM | POA: Diagnosis not present

## 2015-07-25 DIAGNOSIS — F4310 Post-traumatic stress disorder, unspecified: Secondary | ICD-10-CM | POA: Diagnosis present

## 2015-07-25 DIAGNOSIS — M4802 Spinal stenosis, cervical region: Secondary | ICD-10-CM | POA: Diagnosis present

## 2015-07-25 DIAGNOSIS — M479 Spondylosis, unspecified: Secondary | ICD-10-CM | POA: Diagnosis present

## 2015-07-25 DIAGNOSIS — Z7952 Long term (current) use of systemic steroids: Secondary | ICD-10-CM | POA: Diagnosis not present

## 2015-07-25 DIAGNOSIS — M50221 Other cervical disc displacement at C4-C5 level: Secondary | ICD-10-CM | POA: Diagnosis present

## 2015-07-25 DIAGNOSIS — F419 Anxiety disorder, unspecified: Secondary | ICD-10-CM | POA: Diagnosis present

## 2015-07-25 MED ORDER — CYCLOBENZAPRINE HCL 10 MG PO TABS: 10.0000 mg | ORAL_TABLET | Freq: Three times a day (TID) | ORAL | Status: AC | PRN

## 2015-07-25 MED ORDER — HYDROCODONE-ACETAMINOPHEN 5-325 MG PO TABS: 1.0000 | ORAL_TABLET | Freq: Four times a day (QID) | ORAL | Status: AC | PRN

## 2015-07-25 NOTE — Progress Notes (Signed)
Patient alert and oriented, mae's well, voiding adequate amount of urine, swallowing without difficulty, no c/o pain. Patient discharged home with family. Script and discharged instructions given to patient. Patient and family stated understanding of d/c instructions given and has an appointment with MD. 

## 2015-07-25 NOTE — Discharge Summary (Signed)
Physician Discharge Summary  Patient ID: Anthony Russo MRN: [id number] DOB/AGE: 50-08-1965 50 y.o.  Admit date: 07/24/2015 Discharge date: 07/25/2015  Admission Diagnoses: cervical stenosis    Discharge Diagnoses: same   Discharged Condition: good  Hospital Course: The patient was admitted on 07/24/2015 and taken to the operating room where the patient underwent ACDF. The patient tolerated the procedure well and was taken to the recovery room and then to the floor in stable condition. The hospital course was routine. There were no complications. The wound remained clean dry and intact. Pt had appropriate neck soreness. No complaints of arm pain or new N/T/W. The patient remained afebrile with stable vital signs, and tolerated a regular diet. The patient continued to increase activities, and pain was well controlled with oral pain medications.   Consults: None  Significant Diagnostic Studies:  Results for orders placed or performed during the hospital encounter of 07/22/15  Surgical pcr screen  Result Value Ref Range   MRSA, PCR NEGATIVE NEGATIVE   Staphylococcus aureus POSITIVE (A) NEGATIVE  Basic metabolic panel  Result Value Ref Range   Sodium 138 135 - 145 mmol/L   Potassium 4.6 3.5 - 5.1 mmol/L   Chloride 102 101 - 111 mmol/L   CO2 28 22 - 32 mmol/L   Glucose, Bld 91 65 - 99 mg/dL   BUN 7 6 - 20 mg/dL   Creatinine, Ser 7.82 0.61 - 1.24 mg/dL   Calcium 95.6 8.9 - 21.3 mg/dL   GFR calc non Af Amer >60 >60 mL/min   GFR calc Af Amer >60 >60 mL/min   Anion gap 8 5 - 15  CBC WITH DIFFERENTIAL  Result Value Ref Range   WBC 8.4 4.0 - 10.5 K/uL   RBC 4.55 4.22 - 5.81 MIL/uL   Hemoglobin 14.9 13.0 - 17.0 g/dL   HCT 08.6 57.8 - 46.9 %   MCV 95.6 78.0 - 100.0 fL   MCH 32.7 26.0 - 34.0 pg   MCHC 34.3 30.0 - 36.0 g/dL   RDW 62.9 52.8 - 41.3 %   Platelets 320 150 - 400 K/uL   Neutrophils Relative % 43 %   Neutro Abs 3.6 1.7 - 7.7 K/uL   Lymphocytes Relative 45 %   Lymphs  Abs 3.8 0.7 - 4.0 K/uL   Monocytes Relative 8 %   Monocytes Absolute 0.6 0.1 - 1.0 K/uL   Eosinophils Relative 3 %   Eosinophils Absolute 0.3 0.0 - 0.7 K/uL   Basophils Relative 1 %   Basophils Absolute 0.1 0.0 - 0.1 K/uL  Protime-INR  Result Value Ref Range   Prothrombin Time 12.8 11.6 - 15.2 seconds   INR 0.94 0.00 - 1.49  No blood products  Result Value Ref Range   Transfuse no blood products      TRANSFUSE NO BLOOD PRODUCTS, VERIFIED BY TERESA CRABTREE,RN    Chest 2 View  07/22/2015  CLINICAL DATA:  49 year old male -preoperative respiratory evaluation prior to cervical spine surgery. Smoker. EXAM: CHEST  2 VIEW COMPARISON:  None. FINDINGS: The cardiomediastinal silhouette is unremarkable. There is no evidence of focal airspace disease, pulmonary edema, suspicious pulmonary nodule/mass, pleural effusion, or pneumothorax. No acute bony abnormalities are identified. A dense structure along the right first costochondral junction appears bony. IMPRESSION: No active cardiopulmonary disease. Electronically Signed   By: Harmon Pier M.D.   On: 07/22/2015 14:29   Dg Cervical Spine 2-3 Views  07/24/2015  CLINICAL DATA:  C4-6 ACDF EXAM: DG C-ARM 61-120 MIN;  CERVICAL SPINE - 2-3 VIEW COMPARISON:  None FLUOROSCOPY TIME:  24 seconds FINDINGS: Intraoperative fluoroscopic spot image. Anterior cervical fusion at C4-6 with plate and screw in place. IMPRESSION: ACDF C4-6. Electronically Signed   By: Elige KoHetal  Patel   On: 07/24/2015 17:00   Dg C-arm 1-60 Min  07/24/2015  CLINICAL DATA:  C4-6 ACDF EXAM: DG C-ARM 61-120 MIN; CERVICAL SPINE - 2-3 VIEW COMPARISON:  None FLUOROSCOPY TIME:  24 seconds FINDINGS: Intraoperative fluoroscopic spot image. Anterior cervical fusion at C4-6 with plate and screw in place. IMPRESSION: ACDF C4-6. Electronically Signed   By: Elige KoHetal  Patel   On: 07/24/2015 17:00    Antibiotics:  Anti-infectives    Start     Dose/Rate Route Frequency Ordered Stop   07/24/15 2200  ceFAZolin  (ANCEF) IVPB 1 g/50 mL premix     1 g 100 mL/hr over 30 Minutes Intravenous Every 8 hours 07/24/15 2038 07/25/15 0625   07/24/15 1400  bacitracin 50,000 Units in sodium chloride irrigation 0.9 % 500 mL irrigation  Status:  Discontinued       As needed 07/24/15 1458 07/24/15 1704   07/24/15 1330  ceFAZolin (ANCEF) 3 g in dextrose 5 % 50 mL IVPB     3 g 160 mL/hr over 30 Minutes Intravenous To [facility] 07/23/15 1321 07/24/15 1437      Discharge Exam: Blood pressure 137/87, pulse 106, temperature 98.1 F (36.7 C), temperature source Oral, resp. rate 20, height 6\' 2"  (1.88 m), weight 131.77 kg (290 lb 8 oz), SpO2 95 %. Neurologic: Grossly normal Incision ok  Discharge Medications:     Medication List    STOP taking these medications        ibuprofen 800 MG tablet  Commonly known as:  ADVIL,MOTRIN     meloxicam 15 MG tablet  Commonly known as:  MOBIC      TAKE these medications        buPROPion 150 MG 12 hr tablet  Commonly known as:  WELLBUTRIN SR  Take 150 mg by mouth 2 (two) times daily.     cetirizine 10 MG tablet  Commonly known as:  ZYRTEC  Take 10 mg by mouth daily.     cholecalciferol 1000 UNITS tablet  Commonly known as:  VITAMIN D  Take 1,000 Units by mouth daily.     cyclobenzaprine 10 MG tablet  Commonly known as:  FLEXERIL  Take 1 tablet (10 mg total) by mouth 3 (three) times daily as needed for muscle spasms.     FLUoxetine 40 MG capsule  Commonly known as:  PROZAC  Take 40 mg by mouth daily.     gabapentin 100 MG capsule  Commonly known as:  NEURONTIN  Take 100 mg by mouth 3 (three) times daily.     HYDROcodone-acetaminophen 5-325 MG tablet  Commonly known as:  NORCO/VICODIN  Take 1-2 tablets by mouth every 6 (six) hours as needed for moderate pain.     loratadine 10 MG tablet  Commonly known as:  CLARITIN  Take 10 mg by mouth daily.     multivitamin with minerals Tabs tablet  Take 1 tablet by mouth daily.     omeprazole 40 MG  capsule  Commonly known as:  PRILOSEC  Take 40 mg by mouth daily.     predniSONE 50 MG tablet  Commonly known as:  DELTASONE  Take 50 mg by mouth daily with breakfast.        Disposition: home   Final Dx:  ACDF      Discharge Instructions    Call MD for:  difficulty breathing, headache or visual disturbances    Complete by:  As directed      Call MD for:  persistant nausea and vomiting    Complete by:  As directed      Call MD for:  redness, tenderness, or signs of infection (pain, swelling, redness, odor or green/yellow discharge around incision site)    Complete by:  As directed      Call MD for:  severe uncontrolled pain    Complete by:  As directed      Call MD for:  temperature >100.4    Complete by:  As directed      Diet - low sodium heart healthy    Complete by:  As directed      Discharge instructions    Complete by:  As directed   No driving, no heavy lifting     Increase activity slowly    Complete by:  As directed      Remove dressing in 48 hours    Complete by:  As directed            Follow-up Information    Follow up with Daxx Tiggs S, MD. Schedule an appointment as soon as possible for a visit in 2 weeks.   Specialty:  Neurosurgery   Contact information:   [address] [place] [address] [phone]        Signed: Tia Alert 07/25/2015, 7:38 AM

## 2015-07-25 NOTE — Anesthesia Postprocedure Evaluation (Signed)
Anesthesia Post Note  Patient: Anthony Russo  Procedure(s) Performed: Procedure(s): Anterior Cervical Discectomy Fusion Cervical four-five, Cervical five-six  Patient location during evaluation: PACU Anesthesia Type: General Level of consciousness: awake and alert Pain management: pain level controlled Vital Signs Assessment: post-procedure vital signs reviewed and stable Respiratory status: spontaneous breathing, nonlabored ventilation, respiratory function stable and patient connected to nasal cannula oxygen Cardiovascular status: blood pressure returned to baseline and stable Postop Assessment: no signs of nausea or vomiting Anesthetic complications: no    Last Vitals:  Filed Vitals:   07/25/15 0410 07/25/15 0754  BP: 137/87 129/80  Pulse: 106 113  Temp: 36.7 C 36.8 C  Resp: 20 20    Last Pain:  Filed Vitals:   07/25/15 0755  PainSc: 4     LLE Motor Response: Purposeful movement LLE Sensation: Full sensation RLE Motor Response: Purposeful movement RLE Sensation: Full sensation      [name] DAVID

## 2016-08-05 IMAGING — US US SCROTUM
1 series · 13 of 25 positions shown · non-contrast
Comparison: None.

CLINICAL DATA: 49-year-old male with 4 day history of left
testicular pain, swelling and palpable lump.

EXAM:
SCROTAL ULTRASOUND
DOPPLER ULTRASOUND OF THE TESTICLES
TECHNIQUE: Complete ultrasound examination of the testicles, epididymis, and
other scrotal structures was performed. Color and spectral Doppler
ultrasound were also utilized to evaluate blood flow to the
testicles.

[Series 1: us scrotum · 0.08mm/px · 64 acquisitions, 13 frames shown]
[im 1/64]
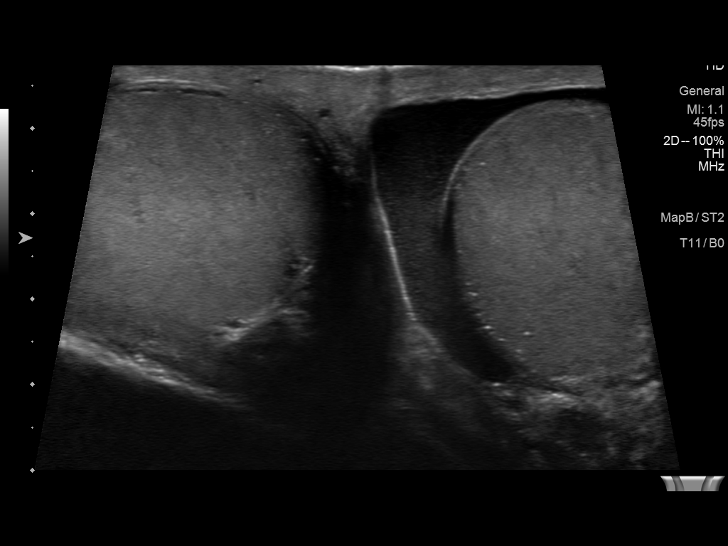
[im 6/64]
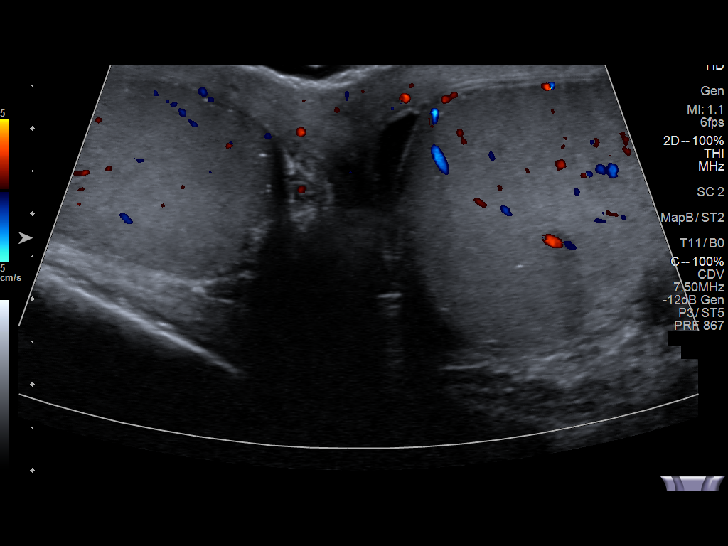
[im 11/64]
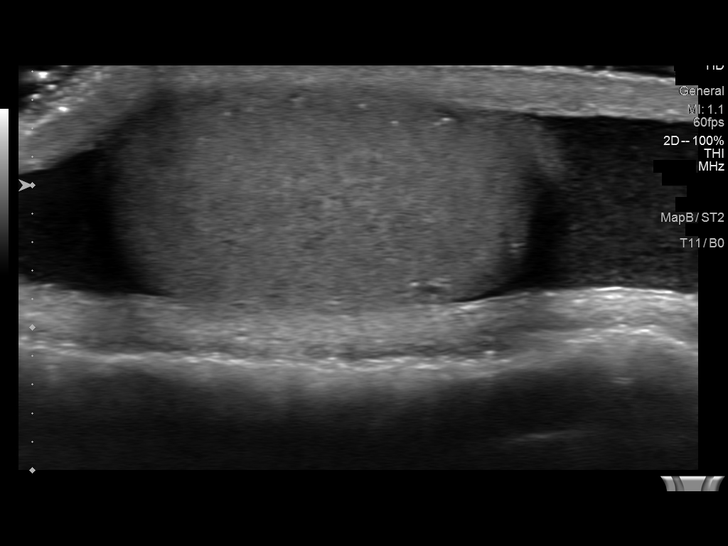
[im 16/64]
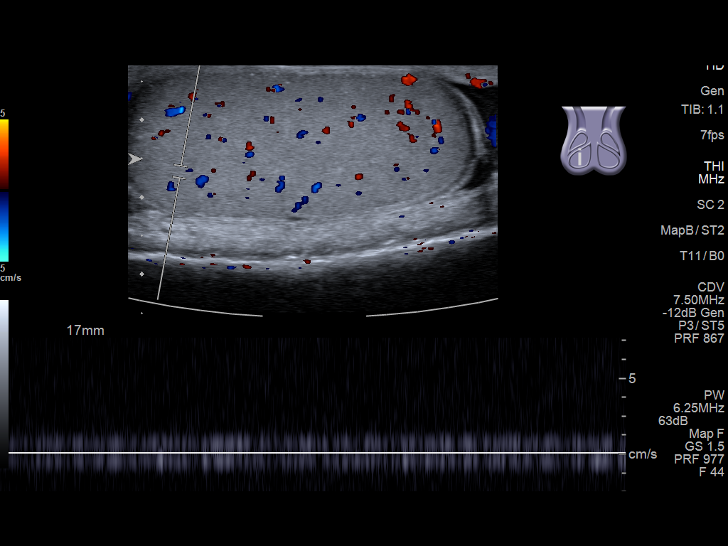
[im 22/64]
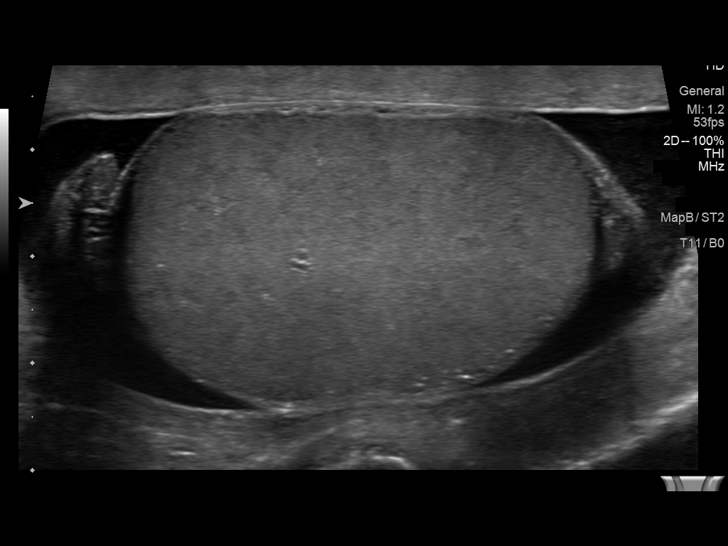
[im 27/64]
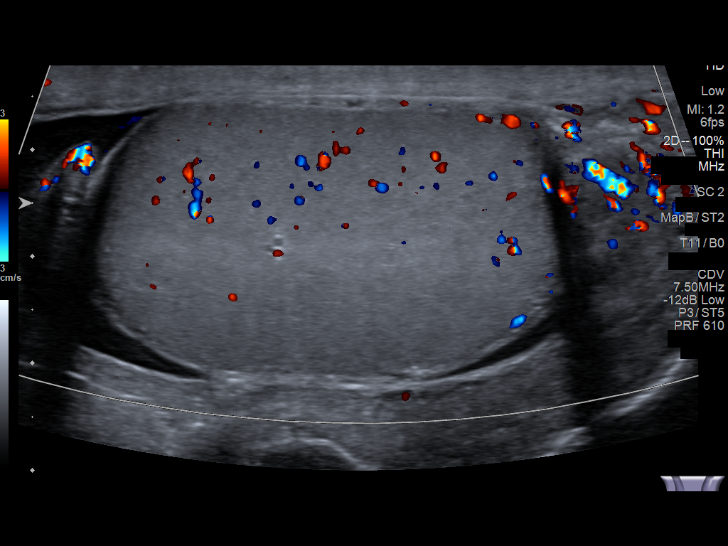
[im 32/64]
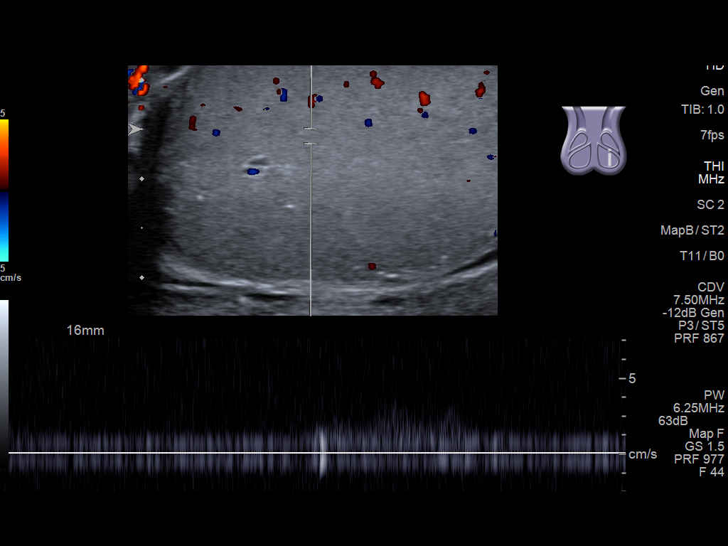
[im 37/64]
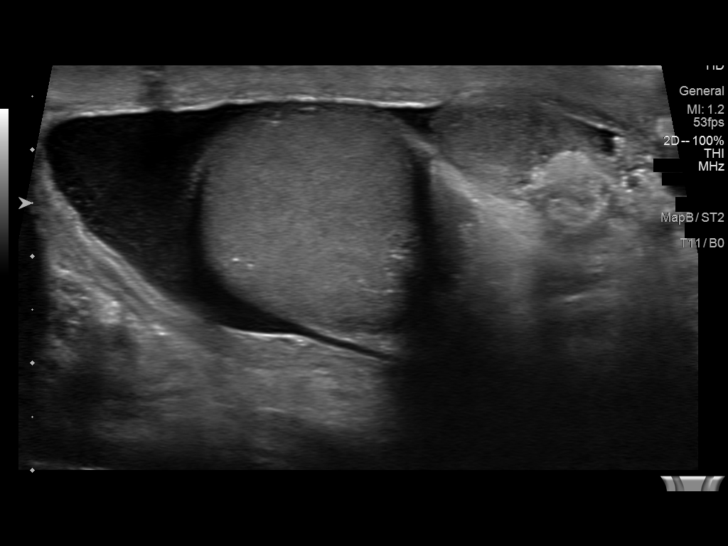
[im 43/64]
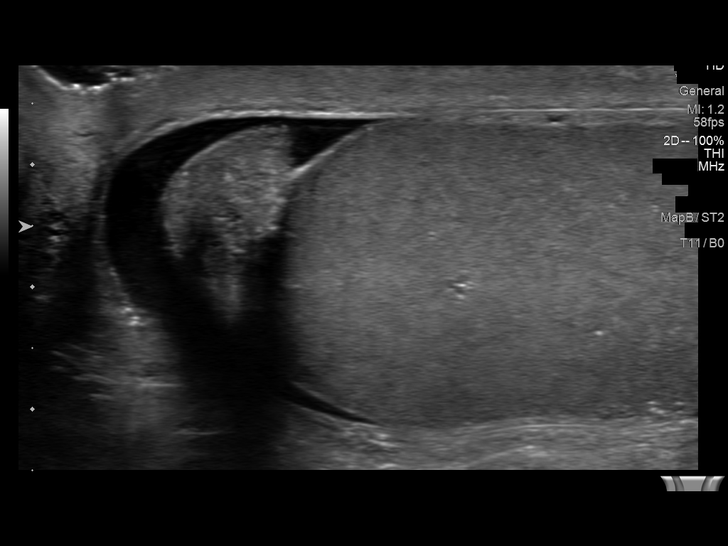
[im 48/64]
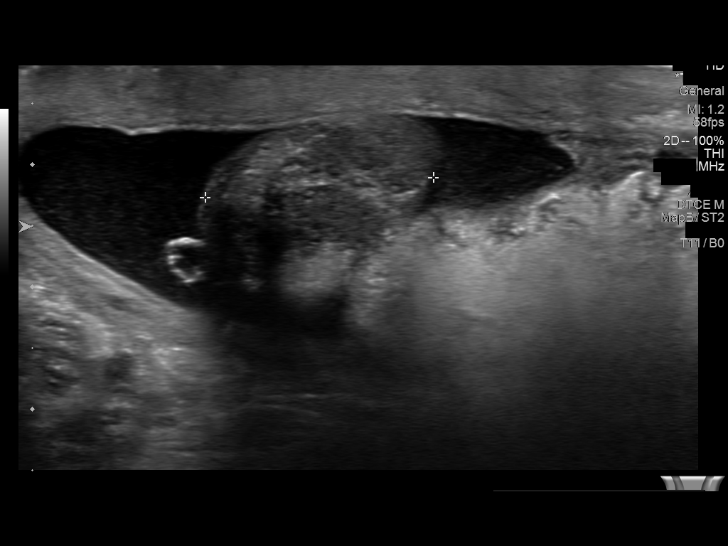
[im 53/64]
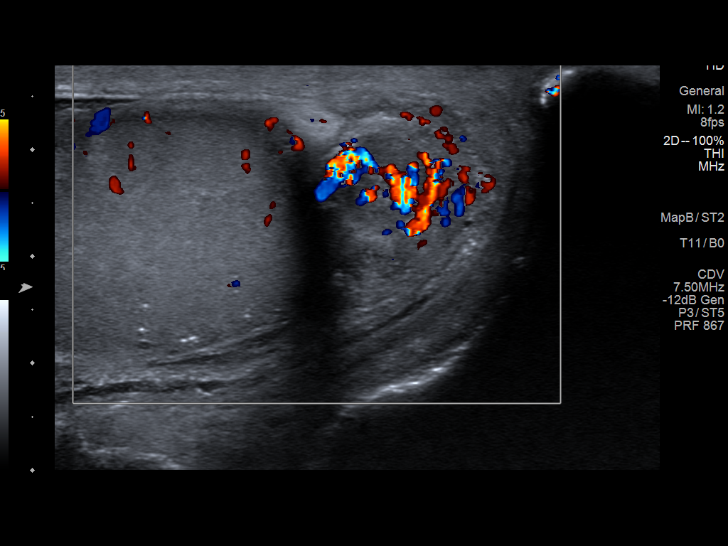
[im 58/64]
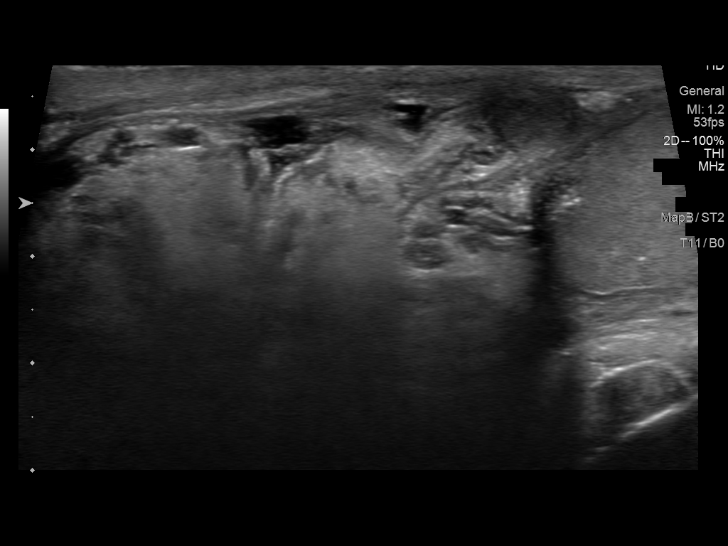
[im 64/64]
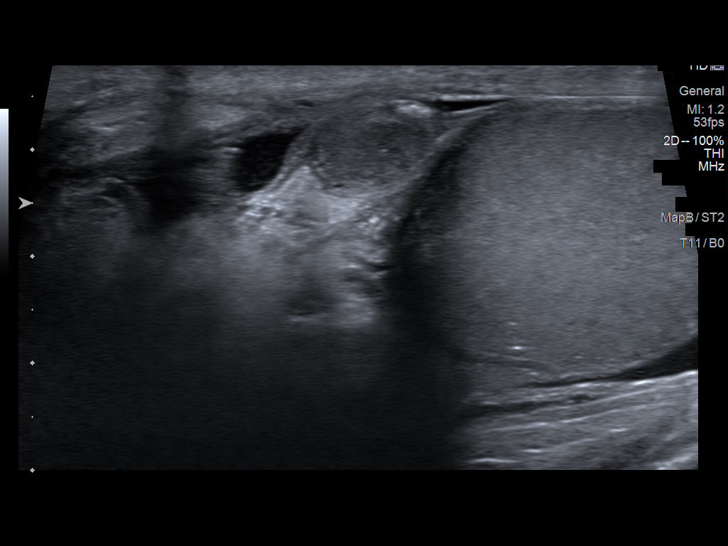

[13 of 25 positions shown; findings below may reference images not displayed]

FINDINGS: Right testicle

Measurements: 4.6 x 2.3 x 2.9 cm. No solid mass. Minimal
microlithiasis.

Left testicle

Measurements: 4.4 x 2.7 x 3.2 cm. No solid mass. Minimal
microlithiasis.

Right epididymis:  Normal in size and appearance.

Left epididymis: Heterogeneous and hypervascular epididymis
(particularly the tail inferiorly) consistent with epididymitis.
There is a 3 x 4 x 4 mm centrally hypoechoic structure with a
peripheral echogenic rim exophytic from the epididymis likely
representing the sequelae of a prior torsed epiploic appendage.

Hydrocele:  Sonographically simple left hydrocele.

Varicocele:  None visualized.

Pulsed Doppler interrogation of both testes demonstrates normal low
resistance arterial and venous waveforms bilaterally.
IMPRESSION: 1. Sonographic findings suggest left epididymitis predominantly
involving the epididymal tail.
2. Sonographically simple left hydrocele is likely reactive to the
underlying epididymitis.
3. Centrally low attention crash then low-attenuation structure
exophytic from the epididymal head with a peripheral echogenic Upadhyay
likely represents the sequelae of a remote portion of an epiploic
appendage.
4. Mild bilateral testicular microlithiasis. Current literature
suggests that testicular microlithiasis is not a significant
independent risk factor for development of testicular carcinoma, and
that follow up imaging is not warranted in the absence of other risk
factors. Monthly testicular self-examination and annual physical
exams are considered appropriate surveillance. If patient has other
risk factors for testicular carcinoma, then referral to Urology
should be considered. (Reference: Nikita, et al.: A 5-Year Follow
up Study of Asymptomatic Men with Testicular Microlithiasis. J Urol
5115; [id number].)

## 2017-02-21 IMAGING — CR DG CHEST 2V
2 series · 2 of 2 positions shown · non-contrast
Comparison: None.

CLINICAL DATA: 49-year-old male -preoperative respiratory
evaluation prior to cervical spine surgery. Smoker.

EXAM:
CHEST  2 VIEW

[w chest pa]
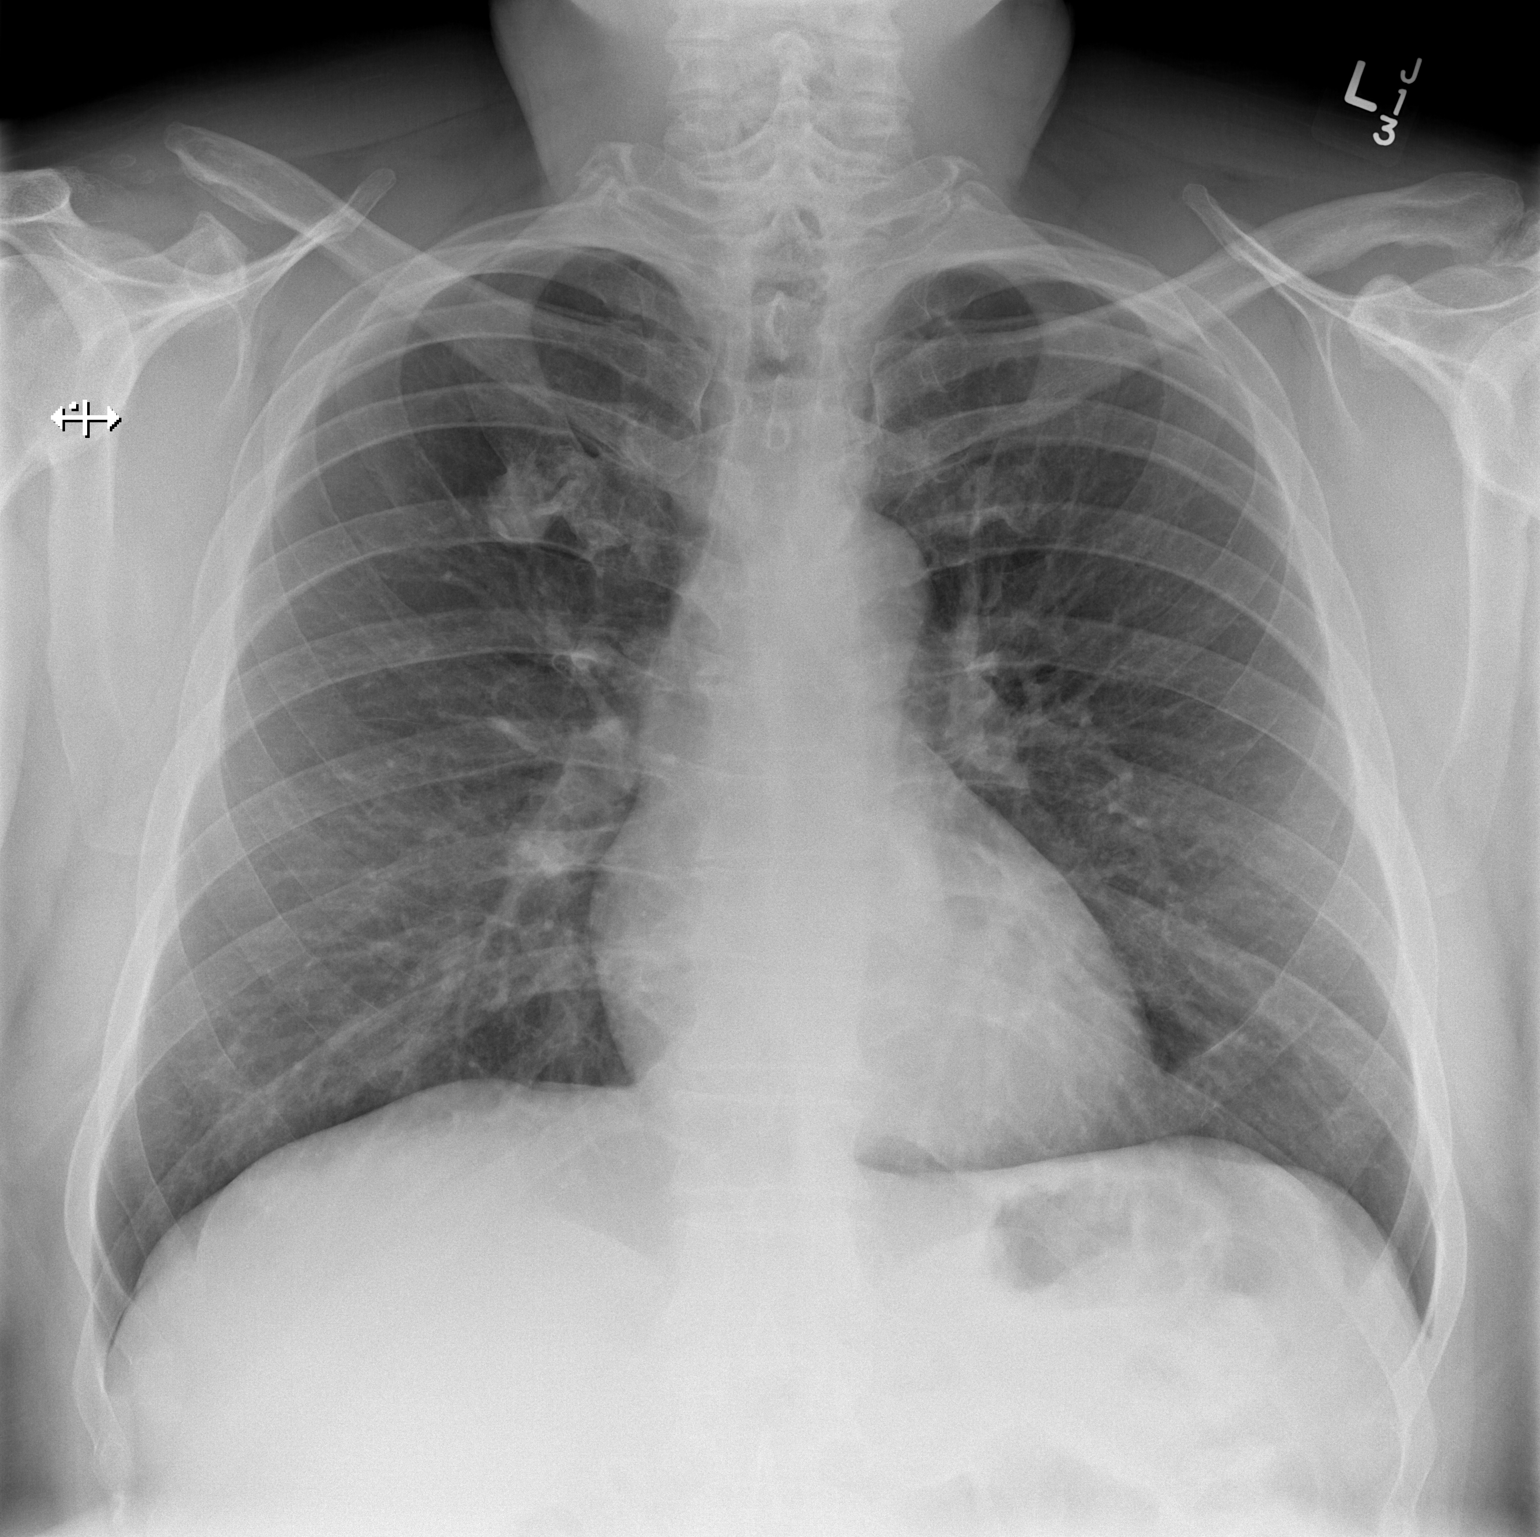

[w chest lat]
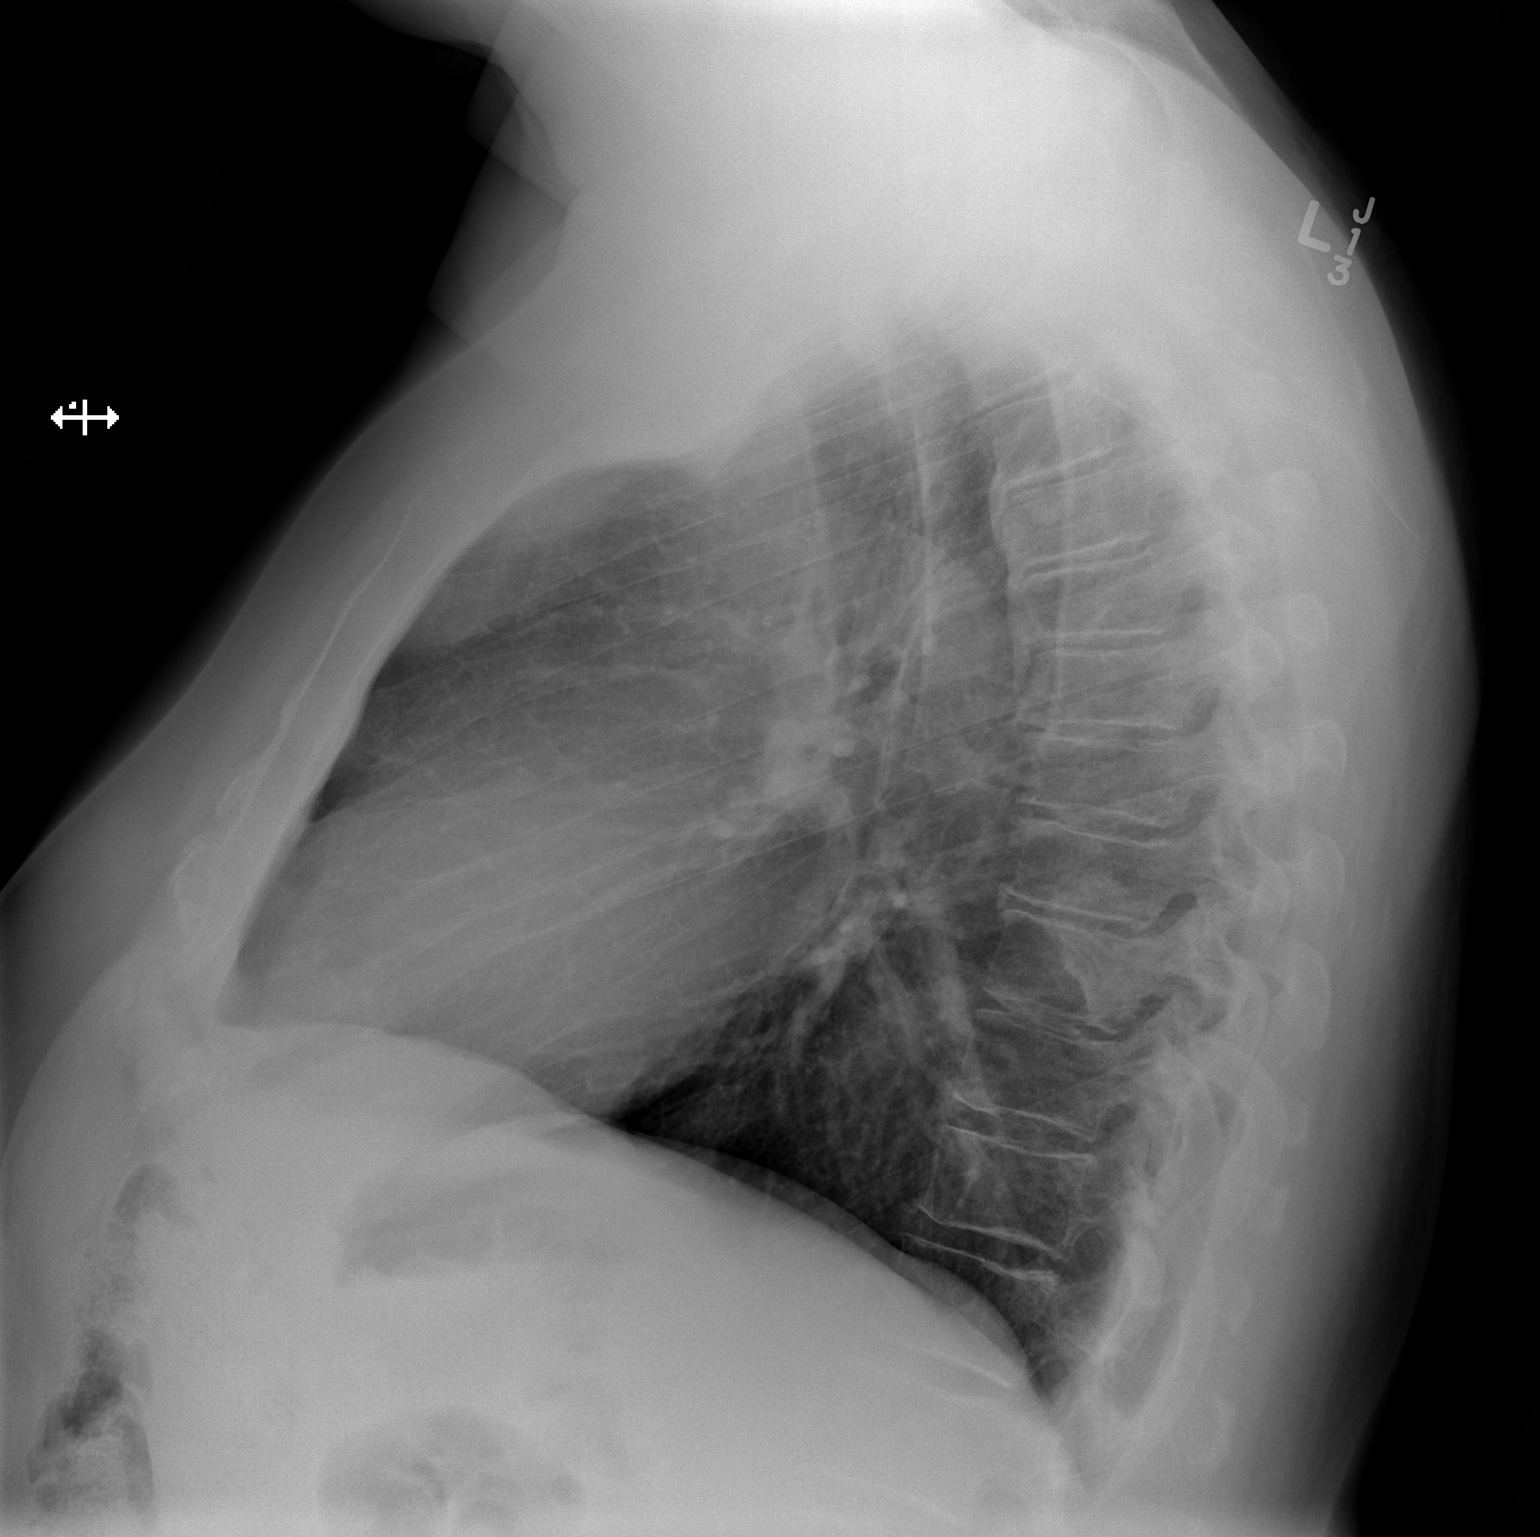

[2 of 2 positions shown; findings below may reference images not displayed]

FINDINGS: The cardiomediastinal silhouette is unremarkable.

There is no evidence of focal airspace disease, pulmonary edema,
suspicious pulmonary nodule/mass, pleural effusion, or pneumothorax.
No acute bony abnormalities are identified.

A dense structure along the right first costochondral junction
appears bony.
IMPRESSION: No active cardiopulmonary disease.
